# Patient Record
Sex: Female | Born: 1980 | Race: White | Hispanic: No | Marital: Single | Smoking: Never smoker
Health system: Southern US, Community
[De-identification: ages and names within clinical notes are randomized; demographics above are authoritative.]

## PROBLEM LIST (undated history)

## (undated) DIAGNOSIS — E119 Type 2 diabetes mellitus without complications: Secondary | ICD-10-CM

## (undated) DIAGNOSIS — E7800 Pure hypercholesterolemia, unspecified: Secondary | ICD-10-CM

## (undated) HISTORY — PX: NO PAST SURGERIES: SHX2092

---

## 2018-03-14 ENCOUNTER — Ambulatory Visit
Admission: EM | Admit: 2018-03-14 | Discharge: 2018-03-14 | Disposition: A | Discharge status: Home / Self Care | Payer: Self-pay | Attending: Family Medicine | Admitting: Family Medicine

## 2018-03-14 ENCOUNTER — Other Ambulatory Visit: Payer: Self-pay

## 2018-03-14 ENCOUNTER — Ambulatory Visit (INDEPENDENT_AMBULATORY_CARE_PROVIDER_SITE_OTHER): Payer: Self-pay

## 2018-03-14 ENCOUNTER — Encounter: Payer: Self-pay | Admitting: Emergency Medicine

## 2018-03-14 DIAGNOSIS — M25532 Pain in left wrist: Secondary | ICD-10-CM

## 2018-03-14 MED ORDER — NAPROXEN 500 MG PO TABS: 500.0000 mg | ORAL_TABLET | Freq: Two times a day (BID) | ORAL | 0 refills | Status: DC

## 2018-03-14 NOTE — ED Provider Notes (Addendum)
[facility]    CSN: [id number] Arrival date & time: 03/14/18  1753     History   Chief Complaint Chief Complaint  Patient presents with  . Wrist Pain    HPI Madeline Bradley is a 37 y.o. female.   Patient is a 37 year old female who presents complaining of left wrist pain.  Patient reports pain is been ongoing for about 2 months and is worsening.  Patient was seen at the [facility] ER on July 13 with similar symptoms and diagnosed with tendinitis.  Patient reports she does wear a wrist brace while at work and has been taking over the counter ibuprofen for pain.  Patient denies any numbness or tingling but does report pain when she is trying to dress her scratching her back.  Patient also reports pain if she tries to push herself up with her left hand on a closed fist (i.e. with the weight load up the arm).  Patient denies any numbness or tingling.  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     History reviewed. No pertinent past medical history.  There are no active problems to display for this patient.   History reviewed. No pertinent surgical history.  OB History   None      Home Medications    Prior to Admission medications   Medication Sig Start Date End Date Taking? Authorizing Provider  ibuprofen (ADVIL,MOTRIN) 800 MG tablet Take 800 mg by mouth every 8 (eight) hours as needed. for pain 12/25/17   [provider]  naproxen (NAPROSYN) 500 MG tablet Take 1 tablet (500 mg total) by mouth 2 (two) times daily. 03/14/18   Candis Schatz, PA-C    Family History History reviewed. No pertinent family history.  Social History Social History   Tobacco Use  . Smoking status: Never Smoker  . Smokeless tobacco: Never Used  Substance Use Topics  . Alcohol use: Not Currently  . Drug use: Never     Allergies   Patient has no known allergies.   Review of Systems Review of Systems as noted  above HPI.  Other systems reviewed and found to be negative.   Physical Exam Triage Vital Signs ED Triage Vitals [03/14/18 1820]  Enc Vitals Group     BP 136/81     Pulse Rate 93     Resp 18     Temp 98.1 F (36.7 C)     Temp Source Oral     SpO2 100 %     Weight 178 lb (80.7 kg)     Height 5\' 5"  (1.651 m)     Head Circumference      Peak Flow      Pain Score 0     Pain Loc      Pain Edu?      Excl. in GC?    No data found.  Updated Vital Signs BP 136/81 (BP Location: Left Arm)   Pulse 93   Temp 98.1 F (36.7 C) (Oral)   Resp 18   Ht 5\' 5"  (1.651 m)   Wt 178 lb (80.7 kg)   LMP 02/25/2018   SpO2 100%   BMI 29.62 kg/m   Physical Exam  Constitutional: She is oriented to person, place, and time. She appears well-developed and well-nourished. No distress.  HENT:  Head: Normocephalic and atraumatic.  Eyes: Pupils are equal, round, and reactive to light. EOM are normal.  Neck: Normal  range of motion. Neck supple.  Pulmonary/Chest: Effort normal.  Musculoskeletal: Normal range of motion.       Left wrist: She exhibits tenderness. She exhibits no effusion and no crepitus.  Tenderness palpation along the wrist near the base of the thumb.  Negative Phalen negative Tinel sign.  Good range of motion.  Neurological: She is alert and oriented to person, place, and time. No cranial nerve deficit.     UC Treatments / Results  Labs (all labs ordered are listed, but only abnormal results are displayed) Labs Reviewed - No data to display  EKG None  Radiology Dg Wrist Complete Left  Result Date: 03/14/2018 CLINICAL DATA:  Radial left wrist pain for the past 2 months. No known injury. EXAM: LEFT WRIST - COMPLETE 3+ VIEW COMPARISON:  None. FINDINGS: There is no evidence of fracture or dislocation. There is no evidence of arthropathy or other focal bone abnormality. Soft tissues are unremarkable. IMPRESSION: Normal examination. Electronically Signed   By: Beckie Salts M.D.    On: 03/14/2018 19:17    Procedures Procedures (including critical care time)  Medications Ordered in UC Medications - No data to display  Initial Impression / Assessment and Plan / UC Course  I have reviewed the triage vital signs and the nursing notes.  Pertinent labs & imaging results that were available during my care of the patient were reviewed by me and considered in my medical decision making (see chart for details).     Patient with left wrist pain.  Will check x-ray.  Has been given diagnosis of tendinitis in the past when seen at the [facility] ER.  Does take over-the-counter ibuprofen and states she does wear a wrist brace during the day and while working.  -X-ray negative for any acute abnormality.  Give patient prescription for naproxen advised not to take ibuprofen will take the naproxen regularly.  Also give her instructions for wrist exercises to help ease the pain.  Also recommend she use her brace while at work.  Also recommend ice therapy for 15 to 20 minutes 3 to 4 times a day.  We will also recommend that she follow-up with orthopedics if no improvement.  Knee brace given to patient at her request.  Final Clinical Impressions(s) / UC Diagnoses   Final diagnoses:  Left wrist pain     Discharge Instructions     -naproxen: one tablet twice a day -do not take ibuprofen while taking the naproxen on a regular basis -xray with no abnormalities -wrist exercises as attached -cold therapy for 15-20 minutes 3-4 times a day -can follow up with orthopedics. [facility] in [place] has an urgent care clinic.    ED Prescriptions    Medication Sig Dispense Auth. Provider   naproxen (NAPROSYN) 500 MG tablet Take 1 tablet (500 mg total) by mouth 2 (two) times daily. 30 tablet Candis Schatz, PA-C     Controlled Substance Prescriptions [place] Controlled Substance Registry consulted? Not Applicable   Candis Schatz, PA-C 03/14/18 1936    Candis Schatz,  PA-C 03/14/18 1943

## 2018-03-14 NOTE — Discharge Instructions (Signed)
-  naproxen: one tablet twice a day -do not take ibuprofen while taking the naproxen on a regular basis -xray with no abnormalities -wrist exercises as attached -cold therapy for 15-20 minutes 3-4 times a day -can follow up with orthopedics. [facility] in [place] has an urgent care clinic.

## 2018-03-14 NOTE — ED Triage Notes (Signed)
Patient c/o left wrist pain x 2 months. Patient denies injury to the area. Patient stated she was seen at [facility] and diagnosed with carpal tunnel and she states the pain is getting worse.

## 2018-05-24 ENCOUNTER — Ambulatory Visit
Admission: EM | Admit: 2018-05-24 | Discharge: 2018-05-24 | Disposition: A | Discharge status: Home / Self Care | Payer: Commercial Managed Care - PPO | Attending: Emergency Medicine | Admitting: Emergency Medicine

## 2018-05-24 ENCOUNTER — Other Ambulatory Visit: Payer: Self-pay

## 2018-05-24 ENCOUNTER — Encounter: Payer: Self-pay | Admitting: Emergency Medicine

## 2018-05-24 DIAGNOSIS — Z8249 Family history of ischemic heart disease and other diseases of the circulatory system: Secondary | ICD-10-CM | POA: Diagnosis not present

## 2018-05-24 DIAGNOSIS — N9089 Other specified noninflammatory disorders of vulva and perineum: Secondary | ICD-10-CM | POA: Diagnosis not present

## 2018-05-24 DIAGNOSIS — R739 Hyperglycemia, unspecified: Secondary | ICD-10-CM | POA: Diagnosis not present

## 2018-05-24 DIAGNOSIS — E1165 Type 2 diabetes mellitus with hyperglycemia: Secondary | ICD-10-CM | POA: Insufficient documentation

## 2018-05-24 DIAGNOSIS — N898 Other specified noninflammatory disorders of vagina: Secondary | ICD-10-CM | POA: Insufficient documentation

## 2018-05-24 LAB — WET PREP, GENITAL
CLUE CELLS WET PREP: NONE SEEN
SPERM: NONE SEEN
TRICH WET PREP: NONE SEEN
YEAST WET PREP: NONE SEEN

## 2018-05-24 LAB — GLUCOSE, CAPILLARY: GLUCOSE-CAPILLARY: 250 mg/dL — AB (ref 70–99)

## 2018-05-24 MED ORDER — TRIAMCINOLONE ACETONIDE 0.1 % EX CREA: 1.0000 "application " | TOPICAL_CREAM | Freq: Two times a day (BID) | CUTANEOUS | 0 refills | Status: DC

## 2018-05-24 MED ORDER — MUPIROCIN 2 % EX OINT: 1.0000 "application " | TOPICAL_OINTMENT | Freq: Two times a day (BID) | CUTANEOUS | 0 refills | Status: DC

## 2018-05-24 NOTE — Discharge Instructions (Addendum)
rinse yourself with lukewarm water instead of wiping or using wipes that have preservatives in it that may irritate the skin even further. Then apply the Bactroban ointment. Then apply a layer zinc paste over the ointment. You can also apply the triamcinolone ointment in the minimal amount necessary to reduce inflammation if needed.   Your sugar was high today.  I would restart your diet and exercise to start bring it down.  Follow-up with your primary care physician of your choice for reevaluation and to possibly be started on metformin.  Here is a list of primary care providers who are taking new patients:  Dr. Elizabeth Sauereanna Jones, Dr. Schuyler AmorWilliam Plonk [address] [place] [address] [phone]  [facility] [place] [address]  [place] [address]  [phone]  [facility] [address]  [place], [place] [address] [phone]  [facility] [place] [address]  [phone] [place], [place] [address]  Here are clinics/ other resources who will see you if you do not have insurance. Some have certain criteria that you must meet. Call them and find out what they are:  [facility]: [address] [place], [place] [address] Phone: [phone] Hours: First and Third Saturdays of each Month, 9 a.m. - 1 p.m.  [facility]: [address], [place], [place] [address] Phone: [phone] Hours: Tuesday, 4 p.m. - 8 p.m. Thursday, 1 p.m. - 8 p.m. Wednesday, 9 a.m. - [facility]  [facility] [address], [place], [place] [address] Phone: [phone] Pharmacy Phone Number: [phone] Dental Phone Number: [phone] [facility] Insurance Help: [phone]  Dental Hours: Monday - Thursday, 8 a.m. - 6 p.m.  [facility] [address] [place], [place] [address] Phone: [phone] Pharmacy Phone Number: [phone] [facility] Insurance Help: [phone]  [facility] [address] [place], [place] [address] Phone: [phone] Pharmacy Phone Number: [phone] [facility] Insurance Help: [phone]  [facility] [address] [place], [place] [address] Phone: [phone] [facility] Insurance Help: [phone]   [facility] [address] [place], [place] [address] Phone: [phone]  Go to www.goodrx.com to look up your medications. This will give you a list of where you can find your prescriptions at the most affordable prices. Or ask the pharmacist what the cash price is, or if they have any other discount programs available to help make your medication more affordable. This can be less expensive than what you would pay with insurance.

## 2018-05-24 NOTE — ED Provider Notes (Signed)
HPI  SUBJECTIVE:  Madeline Bradley is a 37 y.o. female who presents with 1 week of vaginal irritation.  She reports occasional itching, states that it hurts when she wipes and when she walks.  She states that she "digs" at her labia when she does wipe.  She states that she shaves frequently.  She thinks that her labia are rubbed raw.  No labial burning, swelling, known rash.  No vaginal odor, discharge, bleeding.  No dysuria, urgency, frequency, cloudy odorous urine, hematuria.  No fevers, back, abdominal, pelvic pain.  No new perfumes soaps or body washes.  No new lotions.  She had intercourse with a new female partner 1 week ago who is asymptomatic to her knowledge.  STDs are not a concern today.  They did not use condoms.  She is unsure if her symptoms started after intercourse.  She has been applying Neosporin with improvement in her symptoms.  Symptoms are worse with wiping, walking.  She has a past medical history of dry vaginal skin, borderline diabetes which is largely controlled with diet and exercise.  States that she is supposed to be on metformin but has not been on it in several years.  Currently does not have a prescription for metformin.  She does not check her glucose at home.  No history of gonorrhea, chlamydia, HIV, HSV, syphilis, Trichomonas, BV, yeast, PID, ectopic pregnancy, hypertension.  LMP: 3 weeks ago.  Denies the possibility of being pregnant.  PMD: None.    History reviewed. No pertinent past medical history.  Past Surgical History:  Procedure Laterality Date  . NO PAST SURGERIES      Family History  Problem Relation Age of Onset  . Diabetes Mother   . Hypertension Mother   . Hyperlipidemia Mother   . Other Father        unknown medical history    Social History   Tobacco Use  . Smoking status: Never Smoker  . Smokeless tobacco: Never Used  Substance Use Topics  . Alcohol use: Yes    Comment: occassional  . Drug use: Never    No current  facility-administered medications for this encounter.   Current Outpatient Medications:  .  mupirocin ointment (BACTROBAN) 2 %, Apply 1 application topically 2 (two) times daily., Disp: 22 g, Rfl: 0 .  triamcinolone cream (KENALOG) 0.1 %, Apply 1 application topically 2 (two) times daily. Apply for 2 weeks. May use on face, Disp: 30 g, Rfl: 0  No Known Allergies   ROS  As noted in HPI.   Physical Exam  BP (!) 128/94 (BP Location: Left Arm)   Pulse 88   Temp 98 F (36.7 C) (Oral)   Resp 16   Ht 5\' 5"  (1.651 m)   Wt 83.9 kg   LMP 05/03/2018 (Approximate)   SpO2 100%   BMI 30.79 kg/m   Constitutional: Well developed, well nourished, no acute distress Eyes:  EOMI, conjunctiva normal bilaterally HENT: Normocephalic, atraumatic,mucus membranes moist Respiratory: Normal inspiratory effort Cardiovascular: Normal rate GI: nondistended soft, nontender. No suprapubic tenderness  back: No CVA tenderness GU: Shaved genitalia.  External labia erythematous, irritated.  Positive excoriation and fissures.  No ulcers.  Normal vaginal mucosa.  Normal os.  Brownish discharge consistent with menses from os.  No other discharge.  Chaperone present during exam skin: No rash, skin intact Musculoskeletal: no deformities Neurologic: Alert & oriented x 3, no focal neuro deficits Psychiatric: Speech and behavior appropriate   ED Course   Medications -  No data to display  Orders Placed This Encounter  Procedures  . Pelvic exam    Standing Status:   Standing    Number of Occurrences:   1  . Wet prep, genital    Standing Status:   Standing    Number of Occurrences:   1  . Glucose, capillary    Standing Status:   Standing    Number of Occurrences:   1  . CBG monitoring, ED    Standing Status:   Standing    Number of Occurrences:   1    Results for orders placed or performed during the hospital encounter of 05/24/18 (from the past 24 hour(s))  Wet prep, genital     Status: Abnormal    Collection Time: 05/24/18  6:39 PM  Result Value Ref Range   Yeast Wet Prep HPF POC NONE SEEN NONE SEEN   Trich, Wet Prep NONE SEEN NONE SEEN   Clue Cells Wet Prep HPF POC NONE SEEN NONE SEEN   WBC, Wet Prep HPF POC FEW (A) NONE SEEN   Sperm NONE SEEN   Glucose, capillary     Status: Abnormal   Collection Time: 05/24/18  7:06 PM  Result Value Ref Range   Glucose-Capillary 250 (H) 70 - 99 mg/dL   No results found.  ED Clinical Impression  Labial irritation  Hyperglycemia   ED Assessment/Plan  Patient Declined testing for gonorrhea and chlamydia, HIV, RPR.  Wet prep is negative for BV, yeast, trichomonas.  Will check a fingerstick.  Urine not done as she has no urinary complaints.  Denies possibility of being pregnant.  Discontinue Neosporin as the neomycin can contribute to contact dermatitis.    H&P most c/w labial irritation, aggravated with shaving and with scratching/wiping herself.  We will have her do sitz baths, and apply a zinc barrier cream, Bactroban, low potency steroid cream twice a day.  Advised patient to stop Shaving.   Patient also noted to be hyperglycemic, however she states that her diabetes has been controlled in the past with diet and exercise.  Advised her to try this first, and she can follow-up with a primary care physician of her choice for reevaluation and possibly start metformin.   Follow-up with PMD of choice. Will provide primary care referral list for ongoing care.  Discussed labs, MDM, plan and followup with patient. Pt agrees with plan.    Meds ordered this encounter  Medications  . mupirocin ointment (BACTROBAN) 2 %    Sig: Apply 1 application topically 2 (two) times daily.    Dispense:  22 g    Refill:  0  . triamcinolone cream (KENALOG) 0.1 %    Sig: Apply 1 application topically 2 (two) times daily. Apply for 2 weeks. May use on face    Dispense:  30 g    Refill:  0    *This clinic note was created using Scientist, clinical (histocompatibility and immunogenetics).  Therefore, there may be occasional mistakes despite careful proofreading.  ?     Domenick Gong, MD 05/24/18 [phone]

## 2018-05-24 NOTE — ED Triage Notes (Signed)
Patient in today c/o vaginal irritation x 1 week. Patient states it hurts when she wipes. Patient states she "digs when she wipes". Patient states that she has very dry skin. Patient denies discharge or itching. Patient denies urinary symptoms.

## 2018-07-26 ENCOUNTER — Other Ambulatory Visit: Payer: Self-pay

## 2018-07-26 ENCOUNTER — Ambulatory Visit
Admission: EM | Admit: 2018-07-26 | Discharge: 2018-07-26 | Disposition: A | Discharge status: Home / Self Care | Payer: Commercial Managed Care - PPO | Attending: Family Medicine | Admitting: Family Medicine

## 2018-07-26 DIAGNOSIS — M25511 Pain in right shoulder: Secondary | ICD-10-CM | POA: Diagnosis not present

## 2018-07-26 DIAGNOSIS — M25512 Pain in left shoulder: Secondary | ICD-10-CM | POA: Diagnosis not present

## 2018-07-26 MED ORDER — PREDNISONE 20 MG PO TABS: 20.0000 mg | ORAL_TABLET | Freq: Every day | ORAL | 0 refills | Status: DC

## 2018-07-26 MED ORDER — CYCLOBENZAPRINE HCL 10 MG PO TABS: 10.0000 mg | ORAL_TABLET | Freq: Every day | ORAL | 0 refills | Status: DC

## 2018-07-26 NOTE — ED Triage Notes (Signed)
Patient complains of bilateral shoulder pain worse on right than left. Patient states that she has had no injury to area. Patient states that she was seen by [facility] and told not rotator cuff. Patient states that pain is worse when laying down.

## 2018-07-26 NOTE — ED Provider Notes (Signed)
[facility]    CSN: [id number] Arrival date & time: 07/26/18  1844     History   Chief Complaint Chief Complaint  Patient presents with  . Shoulder Pain    bilateral    HPI Valoree Armenteros is a 38 y.o. female.   38 yo female with a c/o bilateral shoulder pain, right greater than left. Denies any injuries, falls, fevers, chills, rash. C/o pain to upper back area.   The history is provided by the patient.    History reviewed. No pertinent past medical history.  There are no active problems to display for this patient.   Past Surgical History:  Procedure Laterality Date  . NO PAST SURGERIES      OB History   No obstetric history on file.      Home Medications    Prior to Admission medications   Medication Sig Start Date End Date Taking? Authorizing Provider  cyclobenzaprine (FLEXERIL) 10 MG tablet Take 1 tablet (10 mg total) by mouth at bedtime. 07/26/18   Payton Mccallum, MD  mupirocin ointment (BACTROBAN) 2 % Apply 1 application topically 2 (two) times daily. 05/24/18   Domenick Gong, MD  predniSONE (DELTASONE) 20 MG tablet Take 1 tablet (20 mg total) by mouth daily. 07/26/18   Payton Mccallum, MD  triamcinolone cream (KENALOG) 0.1 % Apply 1 application topically 2 (two) times daily. Apply for 2 weeks. May use on face 05/24/18   Domenick Gong, MD    Family History Family History  Problem Relation Age of Onset  . Diabetes Mother   . Hypertension Mother   . Hyperlipidemia Mother   . Other Father        unknown medical history    Social History Social History   Tobacco Use  . Smoking status: Never Smoker  . Smokeless tobacco: Never Used  Substance Use Topics  . Alcohol use: Yes    Comment: occasional  . Drug use: Never     Allergies   Patient has no known allergies.   Review of Systems Review of Systems   Physical Exam Triage Vital Signs ED Triage Vitals  Enc Vitals Group     BP 07/26/18 1916 131/89     Pulse Rate  07/26/18 1916 87     Resp 07/26/18 1916 18     Temp 07/26/18 1916 98 F (36.7 C)     Temp Source 07/26/18 1916 Oral     SpO2 07/26/18 1916 100 %     Weight 07/26/18 1915 170 lb (77.1 kg)     Height 07/26/18 1915 5\' 5"  (1.651 m)     Head Circumference --      Peak Flow --      Pain Score 07/26/18 1915 0     Pain Loc --      Pain Edu? --      Excl. in GC? --    No data found.  Updated Vital Signs BP 131/89 (BP Location: Right Arm)   Pulse 87   Temp 98 F (36.7 C) (Oral)   Resp 18   Ht 5\' 5"  (1.651 m)   Wt 77.1 kg   LMP 07/24/2018   SpO2 100%   BMI 28.29 kg/m   Visual Acuity Right Eye Distance:   Left Eye Distance:   Bilateral Distance:    Right Eye Near:   Left Eye Near:    Bilateral Near:     Physical Exam Vitals signs and nursing note reviewed.  Constitutional:  General: She is not in acute distress.    Appearance: Normal appearance. She is not toxic-appearing or diaphoretic.  Musculoskeletal:     Right shoulder: Normal.     Left shoulder: Normal.     Cervical back: She exhibits tenderness (over the right trapezius). She exhibits no bony tenderness, no swelling, no edema, no deformity, no laceration, no pain, no spasm and normal pulse.  Neurological:     Mental Status: She is alert.      UC Treatments / Results  Labs (all labs ordered are listed, but only abnormal results are displayed) Labs Reviewed - No data to display  EKG None  Radiology No results found.  Procedures Procedures (including critical care time)  Medications Ordered in UC Medications - No data to display  Initial Impression / Assessment and Plan / UC Course  I have reviewed the triage vital signs and the nursing notes.  Pertinent labs & imaging results that were available during my care of the patient were reviewed by me and considered in my medical decision making (see chart for details).      Final Clinical Impressions(s) / UC Diagnoses   Final diagnoses:  Right  shoulder pain, unspecified chronicity  Left shoulder pain, unspecified chronicity    ED Prescriptions    Medication Sig Dispense Auth. Provider   cyclobenzaprine (FLEXERIL) 10 MG tablet Take 1 tablet (10 mg total) by mouth at bedtime. 30 tablet Payton Mccallumonty, Xia Stohr, MD   predniSONE (DELTASONE) 20 MG tablet Take 1 tablet (20 mg total) by mouth daily. 5 tablet Payton Mccallumonty, Kayzen Kendzierski, MD     1. diagnosis reviewed with patient 2. rx as per orders above; reviewed possible side effects, interactions, risks and benefits  3. Recommend supportive treatment with otc tylenol prn 4. Follow-up prn if symptoms worsen or don't improve  Controlled Substance Prescriptions [place] Controlled Substance Registry consulted? Not Applicable   Payton Mccallumonty, Ricahrd Schwager, MD 07/26/18 2008

## 2019-01-30 ENCOUNTER — Encounter: Payer: Self-pay | Admitting: Emergency Medicine

## 2019-01-30 ENCOUNTER — Ambulatory Visit
Admission: EM | Admit: 2019-01-30 | Discharge: 2019-01-30 | Disposition: A | Discharge status: Home / Self Care | Payer: Commercial Managed Care - PPO | Attending: Family Medicine | Admitting: Family Medicine

## 2019-01-30 ENCOUNTER — Other Ambulatory Visit: Payer: Self-pay

## 2019-01-30 DIAGNOSIS — R030 Elevated blood-pressure reading, without diagnosis of hypertension: Secondary | ICD-10-CM | POA: Diagnosis not present

## 2019-01-30 NOTE — Discharge Instructions (Signed)
Follow up with PCP. Try and get a sooner appt.  Dietary changes and exercise.  Take care  Dr. Lacinda Axon

## 2019-01-30 NOTE — ED Triage Notes (Signed)
Pt c/o headaches and elevated BP. She states that it has been running 150/90's. She has an appt with a PCP but she could not get an appt until October.

## 2019-02-01 NOTE — ED Provider Notes (Signed)
[facility]    CSN: [id number] Arrival date & time: 01/30/19  1558   History   Chief Complaint Chief Complaint  Patient presents with  . Hypertension  . Headache   HPI  38 year old female presents with the above complaints.  Patient reports that she has had ongoing headaches past week.  Patient is currently headache free.  When asked to tell me the location of her pain, she is unable to do so.  She states that her blood pressure has been elevated for the past 3 days.  She states that it has been running in the 150 over 90s.  BP currently 127/89.  She is currently feeling well.  Patient called her primary care provider and she was instructed to come in for evaluation.  No other reported symptoms.  No other complaints or concerns at this time.  PMH, Surgical Hx, Family Hx, Social History reviewed and updated as below.  PMH: Abnormal Pap smear, Depression, GERD, DM-2  Past Surgical History:  Procedure Laterality Date  . NO PAST SURGERIES      OB History   No obstetric history on file.      Home Medications    Prior to Admission medications   Not on File    Family History Family History  Problem Relation Age of Onset  . Diabetes Mother   . Hypertension Mother   . Hyperlipidemia Mother   . Other Father        unknown medical history    Social History Social History   Tobacco Use  . Smoking status: Never Smoker  . Smokeless tobacco: Never Used  Substance Use Topics  . Alcohol use: Yes    Comment: occasional  . Drug use: Never     Allergies   Patient has no known allergies.   Review of Systems Review of Systems  Cardiovascular:       Elevated BP.  Neurological: Positive for headaches.   Physical Exam Triage Vital Signs ED Triage Vitals  Enc Vitals Group     BP 01/30/19 1622 127/89     Pulse Rate 01/30/19 1622 92     Resp 01/30/19 1622 18     Temp 01/30/19 1622 98.2 F (36.8 C)     Temp Source 01/30/19 1622 Oral     SpO2 01/30/19  1622 99 %     Weight 01/30/19 1619 180 lb (81.6 kg)     Height 01/30/19 1619 5\' 5"  (1.651 m)     Head Circumference --      Peak Flow --      Pain Score 01/30/19 1619 0     Pain Loc --      Pain Edu? --      Excl. in GC? --    Updated Vital Signs BP 127/89 (BP Location: Left Arm)   Pulse 92   Temp 98.2 F (36.8 C) (Oral)   Resp 18   Ht 5\' 5"  (1.651 m)   Wt 81.6 kg   LMP 01/14/2019 (Approximate)   SpO2 99%   BMI 29.95 kg/m   Visual Acuity Right Eye Distance:   Left Eye Distance:   Bilateral Distance:    Right Eye Near:   Left Eye Near:    Bilateral Near:     Physical Exam Vitals signs and nursing note reviewed.  Constitutional:      General: She is not in acute distress.    Appearance: Normal appearance.  HENT:     Head: Normocephalic and  atraumatic.  Eyes:     General:        Right eye: No discharge.        Left eye: No discharge.     Conjunctiva/sclera: Conjunctivae normal.  Cardiovascular:     Rate and Rhythm: Normal rate and regular rhythm.  Pulmonary:     Effort: Pulmonary effort is normal.     Breath sounds: Normal breath sounds. No wheezing, rhonchi or rales.  Neurological:     Mental Status: She is alert.  Psychiatric:        Mood and Affect: Mood normal.        Behavior: Behavior normal.    UC Treatments / Results  Labs (all labs ordered are listed, but only abnormal results are displayed) Labs Reviewed - No data to display  EKG   Radiology No results found.  Procedures Procedures (including critical care time)  Medications Ordered in UC Medications - No data to display  Initial Impression / Assessment and Plan / UC Course  I have reviewed the triage vital signs and the nursing notes.  Pertinent labs & imaging results that were available during my care of the patient were reviewed by me and considered in my medical decision making (see chart for details).    38 year old female presents with recent headaches and reported elevated  blood pressure.  BP stable currently.  She does not have a headache at this time.  Advised dietary and lifestyle changes.  Do not recommend antihypertensive treatment at this time.  Advised follow-up with PCP.  Supportive care.  Final Clinical Impressions(s) / UC Diagnoses   Final diagnoses:  Elevated BP without diagnosis of hypertension     Discharge Instructions     Follow up with PCP. Try and get a sooner appt.  Dietary changes and exercise.  Take care  Dr. Lacinda Axon    ED Prescriptions    None     Controlled Substance Prescriptions [place] Controlled Substance Registry consulted? Not Applicable   [name], [place] 02/01/19 4540

## 2019-05-18 ENCOUNTER — Encounter: Payer: Self-pay | Admitting: Emergency Medicine

## 2019-05-18 ENCOUNTER — Ambulatory Visit
Admission: EM | Admit: 2019-05-18 | Discharge: 2019-05-18 | Disposition: A | Discharge status: Home / Self Care | Payer: Commercial Managed Care - PPO | Attending: Family Medicine | Admitting: Family Medicine

## 2019-05-18 ENCOUNTER — Other Ambulatory Visit: Payer: Self-pay

## 2019-05-18 DIAGNOSIS — Z20822 Contact with and (suspected) exposure to covid-19: Secondary | ICD-10-CM

## 2019-05-18 DIAGNOSIS — Z20828 Contact with and (suspected) exposure to other viral communicable diseases: Secondary | ICD-10-CM

## 2019-05-18 DIAGNOSIS — J069 Acute upper respiratory infection, unspecified: Secondary | ICD-10-CM

## 2019-05-18 HISTORY — DX: Type 2 diabetes mellitus without complications: E11.9

## 2019-05-18 LAB — SARS CORONAVIRUS 2 AG (30 MIN TAT): SARS Coronavirus 2 Ag: NEGATIVE

## 2019-05-18 MED ORDER — BENZONATATE 200 MG PO CAPS: 200.0000 mg | ORAL_CAPSULE | Freq: Three times a day (TID) | ORAL | 0 refills | Status: DC | PRN

## 2019-05-18 NOTE — Discharge Instructions (Addendum)
May return to work once COVID PCR test returns negative.  Cough medication as needed.  Take care  Dr. Lacinda Axon

## 2019-05-18 NOTE — ED Triage Notes (Signed)
Pt c/o cough, and nasal congestion. Started about 2 days ago. She states that she was tested through her work for [employer] yesterday and was negative. Denies fever, shortness of breath.

## 2019-05-18 NOTE — ED Provider Notes (Signed)
[facility]    CSN: [id number] Arrival date & time: 05/18/19  0904  History   Chief Complaint Chief Complaint  Patient presents with  . Cough   HPI   38 year old female presents with cough and congestion as well as runny nose.  Patient reports she developed symptoms 2 days ago.  She states that she has had cough, runny nose, congestion.  She was tested through her work for COVID-19.  She states that it was a saliva test.  She states that she received that negative test results today.  No fever.  No shortness of breath.  She states there has been COVID-19 in her workplace.  She has no known exposure that she is aware of.  No medications or interventions tried.  No known exacerbating factors.  No other complaints.  PMH, Surgical Hx, Family Hx, Social History reviewed and updated as below.  Past Medical History:  Diagnosis Date  . Diabetes mellitus without complication [facility])    Past Surgical History:  Procedure Laterality Date  . NO PAST SURGERIES     OB History   No obstetric history on file.    Home Medications    Prior to Admission medications   Medication Sig Start Date End Date Taking? Authorizing Provider  metFORMIN (GLUCOPHAGE) 500 MG tablet metformin 500 mg tablet  TAKE 1 TABLET BY MOUTH TWICE DAILY WITH MEALS WITH BREAKFAST AND WITH SUPPER 12/11/13  Yes [provider]  benzonatate (TESSALON) 200 MG capsule Take 1 capsule (200 mg total) by mouth 3 (three) times daily as needed for cough. 05/18/19   [name], DO    Family History Family History  Problem Relation Age of Onset  . Diabetes Mother   . Hypertension Mother   . Hyperlipidemia Mother   . Other Father        unknown medical history    Social History Social History   Tobacco Use  . Smoking status: Never Smoker  . Smokeless tobacco: Never Used  Substance Use Topics  . Alcohol use: Yes    Comment: occasional  . Drug use: Never     Allergies   Patient has no known  allergies.   Review of Systems Review of Systems   Physical Exam Triage Vital Signs ED Triage Vitals  Enc Vitals Group     BP 05/18/19 1006 (!) 133/94     Pulse Rate 05/18/19 1006 92     Resp 05/18/19 1006 18     Temp 05/18/19 1006 98.3 F (36.8 C)     Temp Source 05/18/19 1006 Oral     SpO2 05/18/19 1006 100 %     Weight 05/18/19 1002 180 lb (81.6 kg)     Height 05/18/19 1002 5\' 5"  (1.651 m)     Head Circumference --      Peak Flow --      Pain Score 05/18/19 1001 0     Pain Loc --      Pain Edu? --      Excl. in GC? --    No data found.  Updated Vital Signs BP (!) 133/94 (BP Location: Right Arm)   Pulse 92   Temp 98.3 F (36.8 C) (Oral)   Resp 18   Ht 5\' 5"  (1.651 m)   Wt 81.6 kg   LMP 05/11/2019 (Approximate)   SpO2 100%   BMI 29.95 kg/m   Visual Acuity Right Eye Distance:   Left Eye Distance:   Bilateral Distance:  Right Eye Near:   Left Eye Near:    Bilateral Near:     Physical Exam   UC Treatments / Results  Labs (all labs ordered are listed, but only abnormal results are displayed) Labs Reviewed  SARS CORONAVIRUS 2 AG (30 MIN TAT)  NOVEL CORONAVIRUS, NAA (HOSP ORDER, SEND-OUT TO REF LAB; TAT 18-24 HRS)    EKG   Radiology No results found.  Procedures Procedures (including critical care time)  Medications Ordered in UC Medications - No data to display  Initial Impression / Assessment and Plan / UC Course  I have reviewed the triage vital signs and the nursing notes.  Pertinent labs & imaging results that were available during my care of the patient were reviewed by me and considered in my medical decision making (see chart for details).    38 year old female presents with upper respiratory symptoms.  Rapid Covid negative here.  Awaiting send out confirmatory test.  Tessalon Perles for cough.  Supportive care.  Final Clinical Impressions(s) / UC Diagnoses   Final diagnoses:  Upper respiratory tract infection, unspecified type   Encounter for laboratory testing for COVID-19 virus     Discharge Instructions     May return to work once COVID PCR test returns negative.  Cough medication as needed.  Take care  Dr. Lacinda Axon     ED Prescriptions    Medication Sig Dispense Auth. Provider   benzonatate (TESSALON) 200 MG capsule Take 1 capsule (200 mg total) by mouth 3 (three) times daily as needed for cough. 20 capsule [name], DO     PDMP not reviewed this encounter.   [name], [place] 05/18/19 1138

## 2019-05-19 LAB — NOVEL CORONAVIRUS, NAA (HOSP ORDER, SEND-OUT TO REF LAB; TAT 18-24 HRS): SARS-CoV-2, NAA: NOT DETECTED

## 2019-09-15 ENCOUNTER — Ambulatory Visit: Payer: Commercial Managed Care - PPO | Attending: Internal Medicine

## 2019-09-15 ENCOUNTER — Ambulatory Visit: Payer: Commercial Managed Care - PPO

## 2019-09-15 DIAGNOSIS — Z23 Encounter for immunization: Secondary | ICD-10-CM

## 2019-09-15 NOTE — Progress Notes (Signed)
   Covid-19 Vaccination Clinic  Name:  Madeline Bradley    MRN: [id number] DOB: [birth year]  09/15/2019  Madeline Bradley was observed post Covid-19 immunization for 15 minutes without incident. She was provided with Vaccine Information Sheet and instruction to access the V-Safe system.   Madeline Bradley was instructed to call 911 with any severe reactions post vaccine: Marland Kitchen Difficulty breathing  . Swelling of face and throat  . A fast heartbeat  . A bad rash all over body  . Dizziness and weakness   Immunizations Administered    Name Date Dose VIS Date Route   Pfizer COVID-19 Vaccine 09/15/2019 12:20 PM 0.3 mL 05/26/2019 Intramuscular   Manufacturer: [employer], [employer]   Lot: [id number]   NDC: [address]-0

## 2019-10-06 ENCOUNTER — Ambulatory Visit: Payer: Commercial Managed Care - PPO | Attending: Internal Medicine

## 2019-10-06 DIAGNOSIS — Z23 Encounter for immunization: Secondary | ICD-10-CM

## 2019-10-06 NOTE — Progress Notes (Signed)
   Covid-19 Vaccination Clinic  Name:  Tesha Archambeau    MRN: [id number] DOB: [birth year]  10/06/2019  Ms. Delaine was observed post Covid-19 immunization for 15 minutes without incident. She was provided with Vaccine Information Sheet and instruction to access the V-Safe system.   Ms. Ramnath was instructed to call 911 with any severe reactions post vaccine: Marland Kitchen Difficulty breathing  . Swelling of face and throat  . A fast heartbeat  . A bad rash all over body  . Dizziness and weakness   Immunizations Administered    Name Date Dose VIS Date Route   Pfizer COVID-19 Vaccine 10/06/2019  4:05 PM 0.3 mL 08/09/2018 Intramuscular   Manufacturer: [employer], [employer]   Lot: [id number]   NDC: [address]-6

## 2021-02-12 ENCOUNTER — Other Ambulatory Visit: Payer: Self-pay

## 2021-02-12 ENCOUNTER — Ambulatory Visit (INDEPENDENT_AMBULATORY_CARE_PROVIDER_SITE_OTHER): Payer: Commercial Managed Care - PPO

## 2021-02-12 ENCOUNTER — Ambulatory Visit
Admission: EM | Admit: 2021-02-12 | Discharge: 2021-02-12 | Disposition: A | Discharge status: Home / Self Care | Payer: Commercial Managed Care - PPO

## 2021-02-12 DIAGNOSIS — S63502A Unspecified sprain of left wrist, initial encounter: Secondary | ICD-10-CM | POA: Diagnosis not present

## 2021-02-12 DIAGNOSIS — M79642 Pain in left hand: Secondary | ICD-10-CM | POA: Diagnosis not present

## 2021-02-12 DIAGNOSIS — W19XXXA Unspecified fall, initial encounter: Secondary | ICD-10-CM | POA: Diagnosis not present

## 2021-02-12 MED ORDER — IBUPROFEN 600 MG PO TABS: 600.0000 mg | ORAL_TABLET | Freq: Four times a day (QID) | ORAL | 0 refills | Status: DC | PRN

## 2021-02-12 NOTE — Discharge Instructions (Addendum)
Take the ibuprofen, 600 mg every 6 hours with food, as needed for left wrist pain and inflammation.  Wear the wrist splint when you are at work to help keep your wrist in a neutral position and prevent further injury.  After work ice your wrist for 20 minutes at a time to decrease inflammation.  Follow the rehabilitation exercises in the handout in your discharge paperwork to help maintain mobility.  If your symptoms continue I would recommend following up with orthopedics.

## 2021-02-12 NOTE — ED Triage Notes (Signed)
Patient states that she fell on her left hand last week. States that last night she noticed pain with bending or gripping.

## 2021-02-12 NOTE — ED Provider Notes (Signed)
[facility]    CSN: [id number] Arrival date & time: 02/12/21  9379      History   Chief Complaint Chief Complaint  Patient presents with   Fall   Hand Pain    left    HPI Madeline Bradley is a 40 y.o. female.   HPI  40 year old female here for evaluation of left hand and wrist pain.  Patient reports that she fell onto her left side with her hand outstretched a week ago.  She reports that since then she has been having intermittent pain with gripping or flexing her wrist.  She drives a forklift and picks orders.  She states that occasionally when she is using her hand a lot she will get a pins and needle sensation in her hand but she does not have any at present.  Patient also is not having any pain in her wrist at present.  She denies any swelling, bruising, or redness.  Past Medical History:  Diagnosis Date   Diabetes mellitus without complication (HCC)     There are no problems to display for this patient.   Past Surgical History:  Procedure Laterality Date   NO PAST SURGERIES      OB History   No obstetric history on file.      Home Medications    Prior to Admission medications   Medication Sig Start Date End Date Taking? Authorizing Provider  atorvastatin (LIPITOR) 20 MG tablet Take 20 mg by mouth daily. 12/11/20  Yes [provider]  ibuprofen (ADVIL) 600 MG tablet Take 1 tablet (600 mg total) by mouth every 6 (six) hours as needed. 02/12/21  Yes Becky Augusta, NP  JARDIANCE 25 MG TABS tablet Take 25 mg by mouth daily. 02/05/21  Yes [provider]  metFORMIN (GLUCOPHAGE) 500 MG tablet metformin 500 mg tablet  TAKE 1 TABLET BY MOUTH TWICE DAILY WITH MEALS WITH BREAKFAST AND WITH SUPPER 12/11/13  Yes [provider]    Family History Family History  Problem Relation Age of Onset   Diabetes Mother    Hypertension Mother    Hyperlipidemia Mother    Other Father        unknown medical history    Social  History Social History   Tobacco Use   Smoking status: Never   Smokeless tobacco: Never  Vaping Use   Vaping Use: Never used  Substance Use Topics   Alcohol use: Yes    Comment: occasional   Drug use: Never     Allergies   Patient has no known allergies.   Review of Systems Review of Systems  Constitutional:  Negative for activity change, appetite change and fatigue.  Musculoskeletal:  Positive for arthralgias and myalgias. Negative for joint swelling.  Skin:  Negative for color change.  Neurological:  Positive for numbness. Negative for weakness.  Hematological: Negative.   Psychiatric/Behavioral: Negative.      Physical Exam Triage Vital Signs ED Triage Vitals  Enc Vitals Group     BP 02/12/21 1022 (!) 149/91     Pulse Rate 02/12/21 1022 86     Resp 02/12/21 1022 18     Temp 02/12/21 1022 98.4 F (36.9 C)     Temp Source 02/12/21 1022 Oral     SpO2 02/12/21 1022 97 %     Weight 02/12/21 1020 185 lb (83.9 kg)     Height 02/12/21 1020 5\' 5"  (1.651 m)     Head Circumference --  Peak Flow --      Pain Score 02/12/21 1020 3     Pain Loc --      Pain Edu? --      Excl. in GC? --    No data found.  Updated Vital Signs BP (!) 149/91 (BP Location: Right Arm)   Pulse 86   Temp 98.4 F (36.9 C) (Oral)   Resp 18   Ht 5\' 5"  (1.651 m)   Wt 185 lb (83.9 kg)   LMP 02/02/2021   SpO2 97%   BMI 30.79 kg/m   Visual Acuity Right Eye Distance:   Left Eye Distance:   Bilateral Distance:    Right Eye Near:   Left Eye Near:    Bilateral Near:     Physical Exam Vitals and nursing note reviewed.  Constitutional:      General: She is not in acute distress.    Appearance: Normal appearance. She is not ill-appearing.  HENT:     Head: Normocephalic and atraumatic.  Musculoskeletal:        General: Tenderness present. No swelling, deformity or signs of injury. Normal range of motion.  Skin:    General: Skin is warm.     Capillary Refill: Capillary refill  takes less than 2 seconds.     Findings: No bruising or erythema.  Neurological:     General: No focal deficit present.     Mental Status: She is alert and oriented to person, place, and time.     Sensory: No sensory deficit.     Motor: No weakness.  Psychiatric:        Mood and Affect: Mood normal.        Behavior: Behavior normal.        Thought Content: Thought content normal.        Judgment: Judgment normal.     UC Treatments / Results  Labs (all labs ordered are listed, but only abnormal results are displayed) Labs Reviewed - No data to display  EKG   Radiology DG Hand Complete Left  Result Date: 02/12/2021 CLINICAL DATA:  Fall last week, pain EXAM: LEFT HAND - COMPLETE 3+ VIEW COMPARISON:  None. FINDINGS: There is no evidence of fracture or dislocation. There is no evidence of arthropathy or other focal bone abnormality. Soft tissues are unremarkable. IMPRESSION: No fracture or dislocation of the left hand. Joint spaces are preserved. Electronically Signed   By: 02/14/2021 M.D.   On: 02/12/2021 11:04    Procedures Procedures (including critical care time)  Medications Ordered in UC Medications - No data to display  Initial Impression / Assessment and Plan / UC Course  I have reviewed the triage vital signs and the nursing notes.  Pertinent labs & imaging results that were available during my care of the patient were reviewed by me and considered in my medical decision making (see chart for details).  Patient is a nontoxic-appearing 40 year old female here for evaluation of 1 weeks worth of left wrist pain and central palmar pain that is been going on since she fell on outstretched hand.  She states she has been continue to work and use her hand but she will have increases in her pain from time to time.  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  She reports that last night the pain increased and she had a pins and needle sensation in her hand.  The pain  and pins and needle sensation has resolved.  Physical  exam reveals a left hand and wrist that are normal anatomical alignment.  There is no edema, erythema, or ecchymosis present.  When placing patient through full range of motion she endorses the possibility of pain at full extension, flexion, and radial deviation of the wrist but not with ulnar deviation, supination, or pronation.  She states there may be some pain with compression of the radial and ulnar styloids.  There also may be pain with palpation of the carpal bones and with palpation of the third and fourth metacarpals.  There is no deformity noted or crepitus.  Radiograph of left wrist obtained in triage.  Radiographs of left wrist independently reviewed and evaluated by me.  Impression: No evidence of fracture or dislocation.  No significant soft tissue swelling appreciated on exam.  Awaiting radiology overread. Radiology interpretation of left wrist film is negative for fracture or dislocation and joint spaces are well-maintained.  Will place patient in a cock-up wrist splint and treat her for a wrist sprain conservatively with ibuprofen, ice, and range of motion.   Final Clinical Impressions(s) / UC Diagnoses   Final diagnoses:  Left wrist sprain, initial encounter     Discharge Instructions      Take the ibuprofen, 600 mg every 6 hours with food, as needed for left wrist pain and inflammation.  Wear the wrist splint when you are at work to help keep your wrist in a neutral position and prevent further injury.  After work ice your wrist for 20 minutes at a time to decrease inflammation.  Follow the rehabilitation exercises in the handout in your discharge paperwork to help maintain mobility.  If your symptoms continue I would recommend following up with orthopedics.     ED Prescriptions     Medication Sig Dispense Auth. Provider   ibuprofen (ADVIL) 600 MG tablet Take 1 tablet (600 mg total) by mouth every 6 (six) hours  as needed. 30 tablet Becky Augusta, NP      PDMP not reviewed this encounter.   Becky Augusta, NP 02/12/21 1116

## 2021-05-12 ENCOUNTER — Ambulatory Visit
Admission: EM | Admit: 2021-05-12 | Discharge: 2021-05-12 | Disposition: A | Discharge status: Home / Self Care | Payer: Commercial Managed Care - PPO

## 2021-05-12 ENCOUNTER — Other Ambulatory Visit: Payer: Self-pay

## 2021-05-12 DIAGNOSIS — M7712 Lateral epicondylitis, left elbow: Secondary | ICD-10-CM

## 2021-05-12 MED ORDER — METHYLPREDNISOLONE 4 MG PO TBPK: ORAL_TABLET | ORAL | 0 refills | Status: DC

## 2021-05-12 NOTE — ED Provider Notes (Signed)
[facility]    CSN: [id number] Arrival date & time: 05/12/21  0809      History   Chief Complaint Chief Complaint  Patient presents with   Arm Injury    Left arm     HPI Madeline Bradley is a 40 y.o. female.   HPI  40 year old female here for evaluation of left forearm pain.  Patient reports that she is been experiencing pain in her left forearm for last 2 weeks that is associated with a throbbing in the outside of her left elbow.  She states that she cannot think of anything in particular that will provoke the pain and rest does relieve the pain along with taking BC powder.  Patient is left-hand dominant and she works at the [employer] where she does a lot of lifting and repetitive motion.  She denies any numbness, tingling, or weakness in her hand.  Past Medical History:  Diagnosis Date   Diabetes mellitus without complication (HCC)     There are no problems to display for this patient.   Past Surgical History:  Procedure Laterality Date   NO PAST SURGERIES      OB History   No obstetric history on file.      Home Medications    Prior to Admission medications   Medication Sig Start Date End Date Taking? Authorizing Provider  methylPREDNISolone (MEDROL DOSEPAK) 4 MG TBPK tablet Take according to the package insert. 05/12/21  Yes Becky Augusta, NP  atorvastatin (LIPITOR) 20 MG tablet Take 20 mg by mouth daily. 12/11/20   [provider]  ferrous sulfate 325 (65 FE) MG tablet Take by mouth.    [provider]  ibuprofen (ADVIL) 600 MG tablet Take 1 tablet (600 mg total) by mouth every 6 (six) hours as needed. 02/12/21   Becky Augusta, NP  JARDIANCE 25 MG TABS tablet Take 25 mg by mouth daily. 02/05/21   [provider]  metFORMIN (GLUCOPHAGE) 500 MG tablet metformin 500 mg tablet  TAKE 1 TABLET BY MOUTH TWICE DAILY WITH MEALS WITH BREAKFAST AND WITH SUPPER 12/11/13   [provider]    Family History Family History   Problem Relation Age of Onset   Diabetes Mother    Hypertension Mother    Hyperlipidemia Mother    Other Father        unknown medical history    Social History Social History   Tobacco Use   Smoking status: Never   Smokeless tobacco: Never  Vaping Use   Vaping Use: Never used  Substance Use Topics   Alcohol use: Yes    Comment: occasional   Drug use: Never     Allergies   Patient has no known allergies.   Review of Systems Review of Systems  Constitutional:  Negative for activity change, appetite change and fever.  Musculoskeletal:  Positive for arthralgias. Negative for joint swelling.  Skin:  Negative for color change.  Neurological:  Negative for weakness and numbness.  Hematological: Negative.   Psychiatric/Behavioral: Negative.      Physical Exam Triage Vital Signs ED Triage Vitals  Enc Vitals Group     BP 05/12/21 0828 137/87     Pulse Rate 05/12/21 0828 86     Resp 05/12/21 0828 16     Temp 05/12/21 0828 98.7 F (37.1 C)     Temp Source 05/12/21 0828 Oral     SpO2 05/12/21 0828 100 %     Weight --  Height --      Head Circumference --      Peak Flow --      Pain Score 05/12/21 0825 5     Pain Loc --      Pain Edu? --      Excl. in GC? --    No data found.  Updated Vital Signs BP 137/87 (BP Location: Right Arm)   Pulse 86   Temp 98.7 F (37.1 C) (Oral)   Resp 16   LMP 04/21/2021 (Approximate)   SpO2 100%   Visual Acuity Right Eye Distance:   Left Eye Distance:   Bilateral Distance:    Right Eye Near:   Left Eye Near:    Bilateral Near:     Physical Exam Vitals and nursing note reviewed.  Constitutional:      General: She is not in acute distress.    Appearance: Normal appearance. She is not ill-appearing.  HENT:     Head: Normocephalic and atraumatic.  Musculoskeletal:        General: Tenderness present. No swelling, deformity or signs of injury. Normal range of motion.  Skin:    General: Skin is warm and dry.      Capillary Refill: Capillary refill takes less than 2 seconds.     Findings: No bruising or erythema.  Neurological:     General: No focal deficit present.     Mental Status: She is alert and oriented to person, place, and time.     Sensory: No sensory deficit.     Motor: No weakness.  Psychiatric:        Mood and Affect: Mood normal.        Behavior: Behavior normal.        Thought Content: Thought content normal.        Judgment: Judgment normal.     UC Treatments / Results  Labs (all labs ordered are listed, but only abnormal results are displayed) Labs Reviewed - No data to display  EKG   Radiology No results found.  Procedures Procedures (including critical care time)  Medications Ordered in UC Medications - No data to display  Initial Impression / Assessment and Plan / UC Course  I have reviewed the triage vital signs and the nursing notes.  Pertinent labs & imaging results that were available during my care of the patient were reviewed by me and considered in my medical decision making (see chart for details).  Patient is a nontoxic-appearing 40 year old female here for evaluation of left forearm and elbow pain as outlined in HPI above.  Patient's physical exam reveals a left elbow and forearm that are normal anatomical alignment.  Patient has full range of motion of her elbow and wrist without pain or limitation.  There is no edema, erythema, or ecchymosis noted anywhere in the forearm or in the elbow joint.  Patient is distal radial and ulnar pulses are 2+.  Bilateral grips are 5/5.  Patient has no tenderness with palpation of the muscle groups to the posterior lateral forearm or when palpating over the lateral epicondyle of the humerus.  Patient does endorse pain over the lateral epicondyle with passive flexion and extension of her left elbow, as well as supination and pronation of the elbow over the same location.  Suspect patient has lateral epicondylitis, most likely  secondary to her repetitive motion job.  She has been using BC powders with some relief but not any significant improvement.  Patient does have a history of  diabetes.  I have asked her to monitor her sugars as I am going to treat her with a low-dose steroid pack to try and decrease the inflammation.  I have also given her home physical therapy exercises to perform as well as discussed using the compression band that she can purchase at the local drugstore off of [employer].  If she has no improvement of her symptoms in 2 weeks she needs to follow-up with orthopedics.   Final Clinical Impressions(s) / UC Diagnoses   Final diagnoses:  Lateral epicondylitis of left elbow     Discharge Instructions      Take the low dose steroid pack according to the package insert.  Ice your elbow and forearm for 20 minutes after work to decrease inflammation.  Follow the rehab exercises given at triage.  There are compression straps you can wear for your elbow to give support and provide pain relief that you can buy from [facility] or from the drug store.  If your symptoms do not improve after 2 weeks follow up with orthopaedics.      ED Prescriptions     Medication Sig Dispense Auth. Provider   methylPREDNISolone (MEDROL DOSEPAK) 4 MG TBPK tablet Take according to the package insert. 1 each Becky Augusta, NP      PDMP not reviewed this encounter.   Becky Augusta, NP 05/12/21 [phone]

## 2021-05-12 NOTE — ED Triage Notes (Signed)
Patient presents to Urgent Care with complaints of  left lower arm pain x 2 weeks. Pt states limited ROM. Describes pain as a throbbing pain in her left elbow area. Treating symptoms with BC and ibuprofen.

## 2021-05-12 NOTE — Discharge Instructions (Signed)
Take the low dose steroid pack according to the package insert.  Ice your elbow and forearm for 20 minutes after work to decrease inflammation.  Follow the rehab exercises given at triage.  There are compression straps you can wear for your elbow to give support and provide pain relief that you can buy from [facility] or from the drug store.  If your symptoms do not improve after 2 weeks follow up with orthopaedics.

## 2021-07-01 ENCOUNTER — Ambulatory Visit
Admission: EM | Admit: 2021-07-01 | Discharge: 2021-07-01 | Disposition: A | Discharge status: Home / Self Care | Payer: Commercial Managed Care - PPO | Attending: Physician Assistant | Admitting: Physician Assistant

## 2021-07-01 ENCOUNTER — Other Ambulatory Visit: Payer: Self-pay

## 2021-07-01 DIAGNOSIS — J029 Acute pharyngitis, unspecified: Secondary | ICD-10-CM | POA: Insufficient documentation

## 2021-07-01 DIAGNOSIS — J069 Acute upper respiratory infection, unspecified: Secondary | ICD-10-CM | POA: Diagnosis not present

## 2021-07-01 DIAGNOSIS — Z20822 Contact with and (suspected) exposure to covid-19: Secondary | ICD-10-CM | POA: Diagnosis not present

## 2021-07-01 DIAGNOSIS — R051 Acute cough: Secondary | ICD-10-CM | POA: Diagnosis not present

## 2021-07-01 LAB — SARS CORONAVIRUS 2 (TAT 6-24 HRS): SARS Coronavirus 2: NEGATIVE

## 2021-07-01 MED ORDER — PSEUDOEPH-BROMPHEN-DM 30-2-10 MG/5ML PO SYRP: 10.0000 mL | ORAL_SOLUTION | Freq: Four times a day (QID) | ORAL | 0 refills | Status: AC | PRN

## 2021-07-01 NOTE — Discharge Instructions (Signed)
URI/COLD SYMPTOMS: Your exam today is consistent with a viral illness. Antibiotics are not indicated at this time. Use medications as directed, including cough syrup, nasal saline, and decongestants. Your symptoms should improve over the next few days and resolve within 7-10 days. Increase rest and fluids. F/u if symptoms worsen or predominate such as sore throat, ear pain, productive cough, shortness of breath, or if you develop high fevers or worsening fatigue over the next several days.    If COVID-positive you need to isolate 5 days and wear mask for 5 days.  You can would be able to return to work on Thursday as long as you are feeling better and fever free.  Then he should wear your mask for 5 days.

## 2021-07-01 NOTE — ED Triage Notes (Signed)
Patient presents to Urgent Care with complaints of headache, body aches, sore throat, and cough since last Friday. Treating symptoms with robitussin and benadryl.   Denies fever.

## 2021-07-01 NOTE — ED Provider Notes (Signed)
[facility]    CSN: [id number] Arrival date & time: 07/01/21  0911      History   Chief Complaint Chief Complaint  Patient presents with   Generalized Body Aches   Cough   Headache   Sore Throat    HPI Madeline Bradley is a 41 y.o. female presenting for 4-day history of headache, body aches, fatigue, sore throat, cough and congestion.  Denies any associated fever or breathing difficulty.  No diarrhea.  Has been taking Robitussin and Benadryl for symptoms.  No sick contacts or known exposure to COVID or flu.  Does need a COVID test for work.  Reports that her cough was so bad yesterday caused her to vomit 2 times while she was at work.  No other complaints.  HPI  Past Medical History:  Diagnosis Date   Diabetes mellitus without complication (HCC)     There are no problems to display for this patient.   Past Surgical History:  Procedure Laterality Date   NO PAST SURGERIES      OB History   No obstetric history on file.      Home Medications    Prior to Admission medications   Medication Sig Start Date End Date Taking? Authorizing Provider  brompheniramine-pseudoephedrine-DM 30-2-10 MG/5ML syrup Take 10 mLs by mouth 4 (four) times daily as needed for up to 7 days. 07/01/21 07/08/21 Yes Madeline FriendlyEaves, Oluwaseyi Raffel B, PA-C  atorvastatin (LIPITOR) 20 MG tablet Take 20 mg by mouth daily. 12/11/20   [provider]  ferrous sulfate 325 (65 FE) MG tablet Take by mouth.    [provider]  ibuprofen (ADVIL) 600 MG tablet Take 1 tablet (600 mg total) by mouth every 6 (six) hours as needed. 02/12/21   Becky Augustayan, Jeremy, NP  JARDIANCE 25 MG TABS tablet Take 25 mg by mouth daily. 02/05/21   [provider]  metFORMIN (GLUCOPHAGE) 500 MG tablet metformin 500 mg tablet  TAKE 1 TABLET BY MOUTH TWICE DAILY WITH MEALS WITH BREAKFAST AND WITH SUPPER 12/11/13   [provider]  methylPREDNISolone (MEDROL DOSEPAK) 4 MG TBPK tablet Take according to the package  insert. 05/12/21   Becky Augustayan, Jeremy, NP    Family History Family History  Problem Relation Age of Onset   Diabetes Mother    Hypertension Mother    Hyperlipidemia Mother    Other Father        unknown medical history    Social History Social History   Tobacco Use   Smoking status: Never   Smokeless tobacco: Never  Vaping Use   Vaping Use: Never used  Substance Use Topics   Alcohol use: Yes    Comment: occasional   Drug use: Never     Allergies   Patient has no known allergies.   Review of Systems Review of Systems  Constitutional:  Positive for fatigue. Negative for chills, diaphoresis and fever.  HENT:  Positive for congestion, rhinorrhea and sore throat. Negative for ear pain, sinus pressure and sinus pain.   Respiratory:  Positive for cough. Negative for shortness of breath.   Cardiovascular:  Negative for chest pain.  Gastrointestinal:  Positive for vomiting. Negative for abdominal pain and diarrhea.  Musculoskeletal:  Positive for myalgias. Negative for arthralgias.  Skin:  Negative for rash.  Neurological:  Positive for headaches. Negative for weakness.  Hematological:  Negative for adenopathy.    Physical Exam Triage Vital Signs ED Triage Vitals  Enc Vitals Group     BP 07/01/21  0957 (!) 142/94     Pulse Rate 07/01/21 0957 93     Resp 07/01/21 0957 16     Temp 07/01/21 0957 98.3 F (36.8 C)     Temp Source 07/01/21 0957 Oral     SpO2 07/01/21 0957 99 %     Weight --      Height --      Head Circumference --      Peak Flow --      Pain Score 07/01/21 0958 0     Pain Loc --      Pain Edu? --      Excl. in GC? --    No data found.  Updated Vital Signs BP (!) 142/94 (BP Location: Right Arm)    Pulse 93    Temp 98.3 F (36.8 C) (Oral)    Resp 16    SpO2 99%      Physical Exam Vitals and nursing note reviewed.  Constitutional:      General: She is not in acute distress.    Appearance: Normal appearance. She is well-developed. She is  ill-appearing. She is not toxic-appearing.  HENT:     Head: Normocephalic and atraumatic.     Nose: Congestion present.     Mouth/Throat:     Mouth: Mucous membranes are moist.     Pharynx: Oropharynx is clear. Posterior oropharyngeal erythema present.  Eyes:     General: No scleral icterus.       Right eye: No discharge.        Left eye: No discharge.     Conjunctiva/sclera: Conjunctivae normal.  Cardiovascular:     Rate and Rhythm: Normal rate and regular rhythm.     Heart sounds: Normal heart sounds.  Pulmonary:     Effort: Pulmonary effort is normal. No respiratory distress.     Breath sounds: Normal breath sounds. No wheezing, rhonchi or rales.  Musculoskeletal:     Cervical back: Neck supple.  Skin:    General: Skin is dry.  Neurological:     General: No focal deficit present.     Mental Status: She is alert. Mental status is at baseline.     Motor: No weakness.     Gait: Gait normal.  Psychiatric:        Mood and Affect: Mood normal.        Behavior: Behavior normal.        Thought Content: Thought content normal.     UC Treatments / Results  Labs (all labs ordered are listed, but only abnormal results are displayed) Labs Reviewed  SARS CORONAVIRUS 2 (TAT 6-24 HRS)    EKG   Radiology No results found.  Procedures Procedures (including critical care time)  Medications Ordered in UC Medications - No data to display  Initial Impression / Assessment and Plan / UC Course  I have reviewed the triage vital signs and the nursing notes.  Pertinent labs & imaging results that were available during my care of the patient were reviewed by me and considered in my medical decision making (see chart for details).  41 year old female presenting for 4-day history of fatigue, body aches, headaches, cough, congestion and sore throat.  Vital stable.  Patient afebrile.  No respiratory distress.  She is ill-appearing but nontoxic.  Nasal congestion and mild posterior  frontal erythema on exam.  Chest clear auscultation.  PCR COVID testing.  Current CDC guidance, isolation protocol and ED precautions reviewed.  Advised patient she has a  viral illness.  Encouraged increasing rest and fluids.  Sent Bromfed-DM to pharmacy and advised use of nasal sprays, Tylenol Motrin for aches and discomfort.  Advised she should feel better within 7 to 10 days.  Reviewed return and ER precautions and gave her a work note.  Final Clinical Impressions(s) / UC Diagnoses   Final diagnoses:  Viral upper respiratory tract infection  Acute cough  Sore throat     Discharge Instructions      URI/COLD SYMPTOMS: Your exam today is consistent with a viral illness. Antibiotics are not indicated at this time. Use medications as directed, including cough syrup, nasal saline, and decongestants. Your symptoms should improve over the next few days and resolve within 7-10 days. Increase rest and fluids. F/u if symptoms worsen or predominate such as sore throat, ear pain, productive cough, shortness of breath, or if you develop high fevers or worsening fatigue over the next several days.    If COVID-positive you need to isolate 5 days and wear mask for 5 days.  You can would be able to return to work on Thursday as long as you are feeling better and fever free.  Then he should wear your mask for 5 days.     ED Prescriptions     Medication Sig Dispense Auth. Provider   brompheniramine-pseudoephedrine-DM 30-2-10 MG/5ML syrup Take 10 mLs by mouth 4 (four) times daily as needed for up to 7 days. 150 mL Shirlee Latch, PA-C      PDMP not reviewed this encounter.   Shirlee Latch, PA-C 07/01/21 1135

## 2021-07-30 ENCOUNTER — Ambulatory Visit
Admission: EM | Admit: 2021-07-30 | Discharge: 2021-07-30 | Disposition: A | Discharge status: Home / Self Care | Payer: Commercial Managed Care - PPO | Attending: Emergency Medicine | Admitting: Emergency Medicine

## 2021-07-30 ENCOUNTER — Other Ambulatory Visit: Payer: Self-pay

## 2021-07-30 DIAGNOSIS — L03011 Cellulitis of right finger: Secondary | ICD-10-CM | POA: Diagnosis not present

## 2021-07-30 HISTORY — DX: Pure hypercholesterolemia, unspecified: E78.00

## 2021-07-30 MED ORDER — DOXYCYCLINE HYCLATE 100 MG PO CAPS
100.0000 mg | ORAL_CAPSULE | Freq: Two times a day (BID) | ORAL | 0 refills | Status: AC
Start: 2021-07-30 — End: 2021-08-06

## 2021-07-30 NOTE — Discharge Instructions (Signed)
Take the Doxycyline twice daily with food for 7 days.  Soak your finger in warm water and Epsom salts 2-3 times a day to help facilitate drainage.  You can use over-the-counter Tylenol and ibuprofen according to the package instructions as needed for pain.  Return for reevaluation if you develop any increased redness, swelling, drainage, or red streaks going up your finger, or fever.

## 2021-07-30 NOTE — ED Triage Notes (Signed)
Pt reports 1-2 weeks of pain and redness around right thumb nail bed.

## 2021-07-30 NOTE — ED Provider Notes (Signed)
[facility]    CSN: [id number] Arrival date & time: 07/30/21  2620      History   Chief Complaint Chief Complaint  Patient presents with   Thumb Pain    HPI Madeline Bradley is a 41 y.o. female.   HPI  41 year old female here for evaluation of right thumb pain and redness.  Patient ports that she been experiencing pain around the cuticle of her right thumbnail for the last 1 to 2 weeks.  It has been red and painful but it has not been draining.  She denies any fever.  She states that she does not go to nail salon's and she does not bite her nails.  Past Medical History:  Diagnosis Date   Diabetes mellitus without complication (HCC)    High cholesterol     There are no problems to display for this patient.   Past Surgical History:  Procedure Laterality Date   NO PAST SURGERIES      OB History   No obstetric history on file.      Home Medications    Prior to Admission medications   Medication Sig Start Date End Date Taking? Authorizing Provider  doxycycline (VIBRAMYCIN) 100 MG capsule Take 1 capsule (100 mg total) by mouth 2 (two) times daily for 7 days. 07/30/21 08/06/21 Yes Becky Augusta, NP  ferrous sulfate 325 (65 FE) MG tablet Take by mouth.    [provider]  JARDIANCE 25 MG TABS tablet Take 25 mg by mouth daily. 02/05/21   [provider]  metFORMIN (GLUCOPHAGE) 500 MG tablet metformin 500 mg tablet  TAKE 1 TABLET BY MOUTH TWICE DAILY WITH MEALS WITH BREAKFAST AND WITH SUPPER 12/11/13   [provider]    Family History Family History  Problem Relation Age of Onset   Diabetes Mother    Hypertension Mother    Hyperlipidemia Mother    Other Father        unknown medical history    Social History Social History   Tobacco Use   Smoking status: Never    Passive exposure: Never   Smokeless tobacco: Never  Vaping Use   Vaping Use: Never used  Substance Use Topics   Alcohol use: Yes    Comment: occasional    Drug use: Never     Allergies   Patient has no known allergies.   Review of Systems Review of Systems  Constitutional:  Negative for activity change, appetite change and fever.  Musculoskeletal:  Negative for arthralgias and joint swelling.  Skin:  Positive for color change.  Neurological:  Negative for numbness.  Hematological: Negative.   Psychiatric/Behavioral: Negative.      Physical Exam Triage Vital Signs ED Triage Vitals  Enc Vitals Group     BP 07/30/21 0918 136/86     Pulse Rate 07/30/21 0918 95     Resp 07/30/21 0918 16     Temp 07/30/21 0918 98.1 F (36.7 C)     Temp Source 07/30/21 0918 Oral     SpO2 07/30/21 0918 100 %     Weight 07/30/21 0917 177 lb (80.3 kg)     Height 07/30/21 0917 5\' 5"  (1.651 m)     Head Circumference --      Peak Flow --      Pain Score --      Pain Loc --      Pain Edu? --      Excl. in GC? --    No  data found.  Updated Vital Signs BP 136/86 (BP Location: Left Arm)    Pulse 95    Temp 98.1 F (36.7 C) (Oral)    Resp 16    Ht 5\' 5"  (1.651 m)    Wt 177 lb (80.3 kg)    LMP 07/01/2021    SpO2 100%    BMI 29.45 kg/m   Visual Acuity Right Eye Distance:   Left Eye Distance:   Bilateral Distance:    Right Eye Near:   Left Eye Near:    Bilateral Near:     Physical Exam Vitals and nursing note reviewed.  Constitutional:      Appearance: Normal appearance. She is not ill-appearing.  HENT:     Head: Normocephalic and atraumatic.  Skin:    General: Skin is warm.     Capillary Refill: Capillary refill takes less than 2 seconds.     Findings: Erythema present. No lesion or rash.  Neurological:     General: No focal deficit present.     Mental Status: She is alert and oriented to person, place, and time.     Sensory: No sensory deficit.  Psychiatric:        Mood and Affect: Mood normal.        Behavior: Behavior normal.        Thought Content: Thought content normal.        Judgment: Judgment normal.     UC Treatments /  Results  Labs (all labs ordered are listed, but only abnormal results are displayed) Labs Reviewed - No data to display  EKG   Radiology No results found.  Procedures Procedures (including critical care time)  Medications Ordered in UC Medications - No data to display  Initial Impression / Assessment and Plan / UC Course  I have reviewed the triage vital signs and the nursing notes.  Pertinent labs & imaging results that were available during my care of the patient were reviewed by me and considered in my medical decision making (see chart for details).  Patient is a very pleasant, nontoxic-appearing 41 year old female here for evaluation of pain and redness to the lateral and proximal edge of her cuticle to her right thumbnail there is no fluctuance or induration.  She denies any drainage.  On exam patient has full range of motion of her thumb and denies any tenderness at the IP joint.  The skin around the cuticle is dried, cracked, and peeling.  Patient states that she does not bite her nails, to the skin off, and does not frequent nail salon's.  Patient's exam is consistent with a paronychia.  I will discharge her home on doxycycline twice daily for 7 days.  I have also encouraged her to soak her finger in warm water and Epsom salts to see if she can facilitate any drainage.  We discussed return precautions to include worsening symptoms.  Patient denies need for work note.   Final Clinical Impressions(s) / UC Diagnoses   Final diagnoses:  Paronychia of thumb, right     Discharge Instructions      Take the Doxycyline twice daily with food for 7 days.  Soak your finger in warm water and Epsom salts 2-3 times a day to help facilitate drainage.  You can use over-the-counter Tylenol and ibuprofen according to the package instructions as needed for pain.  Return for reevaluation if you develop any increased redness, swelling, drainage, or red streaks going up your finger, or  fever.  ED Prescriptions     Medication Sig Dispense Auth. Provider   doxycycline (VIBRAMYCIN) 100 MG capsule Take 1 capsule (100 mg total) by mouth 2 (two) times daily for 7 days. 14 capsule Becky Augusta, NP      PDMP not reviewed this encounter.   Becky Augusta, NP 07/30/21 1030

## 2022-03-01 ENCOUNTER — Emergency Department: Payer: Commercial Managed Care - PPO

## 2022-03-01 ENCOUNTER — Other Ambulatory Visit: Payer: Self-pay

## 2022-03-01 ENCOUNTER — Emergency Department
Admission: EM | Admit: 2022-03-01 | Discharge: 2022-03-01 | Disposition: A | Discharge status: Home / Self Care | Payer: Commercial Managed Care - PPO | Attending: Emergency Medicine | Admitting: Emergency Medicine

## 2022-03-01 DIAGNOSIS — M545 Low back pain, unspecified: Secondary | ICD-10-CM | POA: Insufficient documentation

## 2022-03-01 DIAGNOSIS — E119 Type 2 diabetes mellitus without complications: Secondary | ICD-10-CM | POA: Diagnosis not present

## 2022-03-01 DIAGNOSIS — S069X9A Unspecified intracranial injury with loss of consciousness of unspecified duration, initial encounter: Secondary | ICD-10-CM | POA: Insufficient documentation

## 2022-03-01 DIAGNOSIS — S199XXA Unspecified injury of neck, initial encounter: Secondary | ICD-10-CM | POA: Diagnosis not present

## 2022-03-01 DIAGNOSIS — S60919A Unspecified superficial injury of unspecified wrist, initial encounter: Secondary | ICD-10-CM | POA: Insufficient documentation

## 2022-03-01 DIAGNOSIS — Y92410 Unspecified street and highway as the place of occurrence of the external cause: Secondary | ICD-10-CM | POA: Diagnosis not present

## 2022-03-01 DIAGNOSIS — S6990XA Unspecified injury of unspecified wrist, hand and finger(s), initial encounter: Secondary | ICD-10-CM
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
## 2022-03-01 NOTE — Discharge Instructions (Signed)
Take acetaminophen 650 mg and ibuprofen 400 mg every 6 hours for pain.  Take with food. Thank you for choosing us for your health care today!  Please see your primary doctor this week for a follow up appointment.   If you do not have a primary doctor call the following clinics to establish care:  If you have insurance:  [facility] [phone] [address] [place] [address]   [facility]  [phone] [address] [place] [address]   If you do not have insurance:  [facility]  [phone] [address] [place] [address]  Sometimes, in the early stages of certain disease courses it is difficult to detect in the emergency department evaluation -- so, it is important that you continue to monitor your symptoms and call your doctor right away or return to the emergency department if you develop any new or worsening symptoms.  It was my pleasure to care for you today.   Alisen Marsiglia S. Waleska Buttery, MD  

## 2022-04-24 ENCOUNTER — Ambulatory Visit
Admission: EM | Admit: 2022-04-24 | Discharge: 2022-04-24 | Disposition: A | Discharge status: Home / Self Care | Payer: Commercial Managed Care - PPO | Attending: Emergency Medicine | Admitting: Emergency Medicine

## 2022-04-24 DIAGNOSIS — L03031 Cellulitis of right toe: Secondary | ICD-10-CM

## 2022-04-24 MED ORDER — IBUPROFEN 800 MG PO TABS
800.0000 mg | ORAL_TABLET | Freq: Three times a day (TID) | ORAL | 0 refills | Status: AC
Start: 2022-04-24 — End: ?

## 2022-04-24 MED ORDER — DOXYCYCLINE HYCLATE 100 MG PO CAPS: 100.0000 mg | ORAL_CAPSULE | Freq: Two times a day (BID) | ORAL | 0 refills | Status: AC

## 2022-04-24 NOTE — Discharge Instructions (Signed)
Today you are being treated for infection to your toenail  Begin doxycycline every morning and every evening for 10 days, ideally you will begin to see improvement in about 48 hours and steady progression from there  You may complete warm soaks to the toe to help reduce swelling and facilitate drainage or you may hold warm compresses whichever is easier  You may take ibuprofen 800 mg every 8 hours as well as Tylenol 500 to 1000 mg every 6 hours for management of discomfort  Do Not attempt to cut toenails until all symptoms have resolved if there is any other concerns regarding a ingrown toenail you may follow-up with podiatry for removal after infection has resolved  After completion of antibiotic if you continue to see signs of infection please follow-up for reevaluation with urgent care or primary doctor

## 2022-04-24 NOTE — ED Triage Notes (Signed)
Pt c/o right first toe pain, drainage, swelling, and redness x2weeks.

## 2022-04-24 NOTE — ED Provider Notes (Signed)
[facility]    CSN: [id number] Arrival date & time: 04/24/22  1033      History   Chief Complaint Chief Complaint  Patient presents with   Toe Pain         HPI Madeline Bradley is a 41 y.o. female.   Presents with erythema, yellow puslike drainage and tenderness present to the right great toe for 2 weeks.  Symptoms began after cutting into toenail with clippers.  Has been cleansing with peroxide with no relief.  Able to bear weight, range of motion of the toe intact..  Denies fevers.  History of diabetes.    Past Medical History:  Diagnosis Date   Diabetes mellitus without complication (HCC)    High cholesterol     There are no problems to display for this patient.   Past Surgical History:  Procedure Laterality Date   NO PAST SURGERIES      OB History   No obstetric history on file.      Home Medications    Prior to Admission medications   Medication Sig Start Date End Date Taking? Authorizing Provider  ferrous sulfate 325 (65 FE) MG tablet Take by mouth.   Yes [provider]  JARDIANCE 25 MG TABS tablet Take 25 mg by mouth daily. 02/05/21  Yes [provider]  metFORMIN (GLUCOPHAGE) 500 MG tablet metformin 500 mg tablet  TAKE 1 TABLET BY MOUTH TWICE DAILY WITH MEALS WITH BREAKFAST AND WITH SUPPER 12/11/13  Yes [provider]    Family History Family History  Problem Relation Age of Onset   Diabetes Mother    Hypertension Mother    Hyperlipidemia Mother    Other Father        unknown medical history    Social History Social History   Tobacco Use   Smoking status: Never    Passive exposure: Never   Smokeless tobacco: Never  Vaping Use   Vaping Use: Never used  Substance Use Topics   Alcohol use: Yes    Comment: occasional   Drug use: Never     Allergies   Patient has no known allergies.   Review of Systems Review of Systems  Constitutional: Negative.   Respiratory: Negative.     Cardiovascular: Negative.   Musculoskeletal: Negative.   Skin:  Positive for wound. Negative for color change, pallor and rash.     Physical Exam Triage Vital Signs ED Triage Vitals  Enc Vitals Group     BP 04/24/22 1045 (!) 150/83     Pulse Rate 04/24/22 1045 92     Resp 04/24/22 1045 18     Temp 04/24/22 1045 98 F (36.7 C)     Temp Source 04/24/22 1045 Oral     SpO2 04/24/22 1045 100 %     Weight 04/24/22 1044 180 lb (81.6 kg)     Height 04/24/22 1044 5\' 5"  (1.651 m)     Head Circumference --      Peak Flow --      Pain Score 04/24/22 1044 10     Pain Loc --      Pain Edu? --      Excl. in [place]? --    No data found.  Updated Vital Signs BP (!) 150/83 (BP Location: Left Arm)   Pulse 92   Temp 98 F (36.7 C) (Oral)   Resp 18   Ht 5\' 5"  (1.651 m)   Wt 180 lb (81.6 kg)   LMP  03/24/2022   SpO2 100%   BMI 29.95 kg/m   Visual Acuity Right Eye Distance:   Left Eye Distance:   Bilateral Distance:    Right Eye Near:   Left Eye Near:    Bilateral Near:     Physical Exam Constitutional:      Appearance: Normal appearance.  HENT:     Head: Normocephalic.  Eyes:     Extraocular Movements: Extraocular movements intact.  Pulmonary:     Effort: Pulmonary effort is normal.  Skin:    Comments: Erythema, mild to moderate swelling and tenderness following the medial and proximal nailbed with puslike drainage along the medial aspect, range of motion intact, able to bear weight, capillary refill less than 3  Neurological:     Mental Status: She is alert and oriented to person, place, and time. Mental status is at baseline.  Psychiatric:        Mood and Affect: Mood normal.        Behavior: Behavior normal.      UC Treatments / Results  Labs (all labs ordered are listed, but only abnormal results are displayed) Labs Reviewed - No data to display  EKG   Radiology No results found.  Procedures Procedures (including critical care time)  Medications Ordered  in UC Medications - No data to display  Initial Impression / Assessment and Plan / UC Course  I have reviewed the triage vital signs and the nursing notes.  Pertinent labs & imaging results that were available during my care of the patient were reviewed by me and considered in my medical decision making (see chart for details).  Paronychia of the right great toe right foot  Presentation is consistent with infection, discussed with patient,'s doxycycline prescribed, 10-day course given due to history of diabetes, recommended warm soaks or compresses to the affected area and ibuprofen 800 mg prescribed for management of discomfort, given walking referral to podiatry if symptoms persist or worsen and for removal of ingrown toenail after infection has subsided, may follow-up with his urgent care as needed Final Clinical Impressions(s) / UC Diagnoses   Final diagnoses:  None   Discharge Instructions   None    ED Prescriptions   None    PDMP not reviewed this encounter.   Hans Eden, NP 04/24/22 1122

## 2022-11-20 IMAGING — CR DG HAND COMPLETE 3+V*L*
3 series · 3 of 3 positions shown · non-contrast
Comparison: None.

CLINICAL DATA: Fall last week, pain

EXAM:
LEFT HAND - COMPLETE 3+ VIEW

[hand ap]
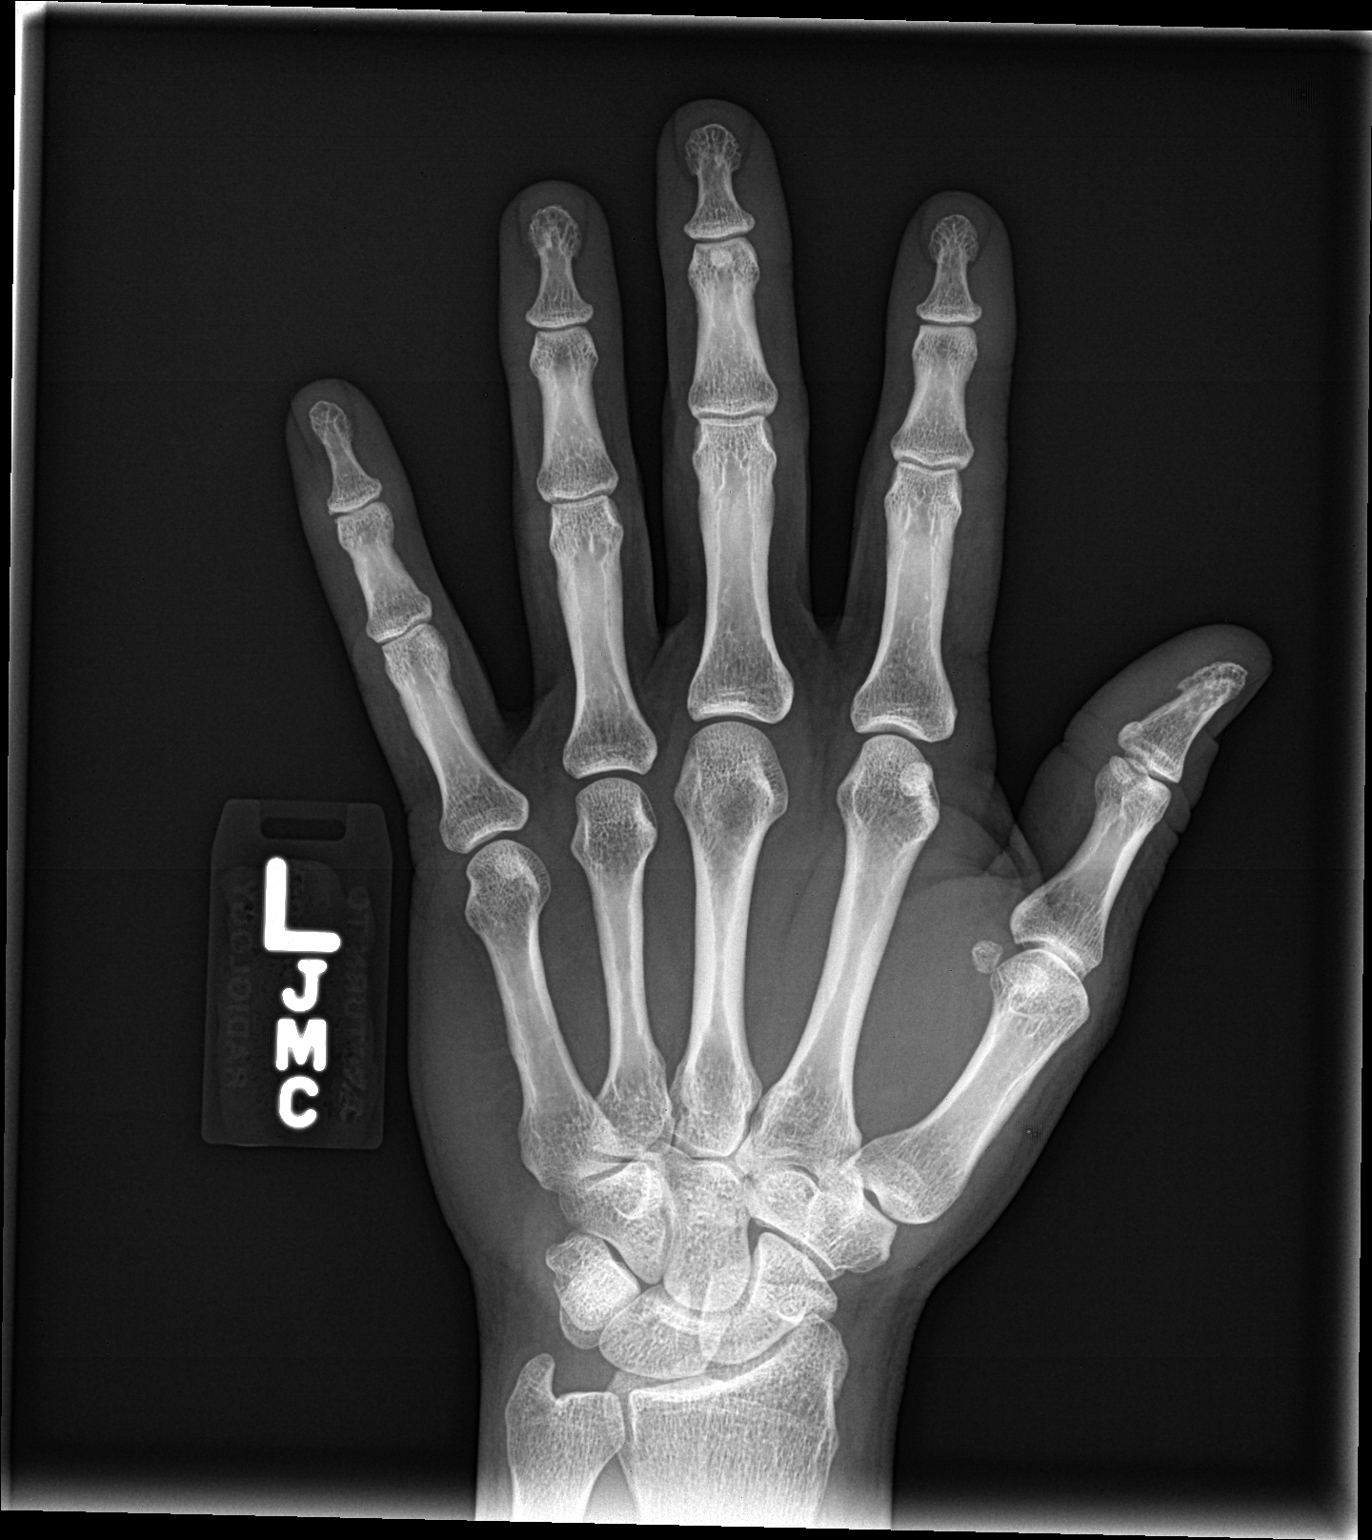

[hand obl]
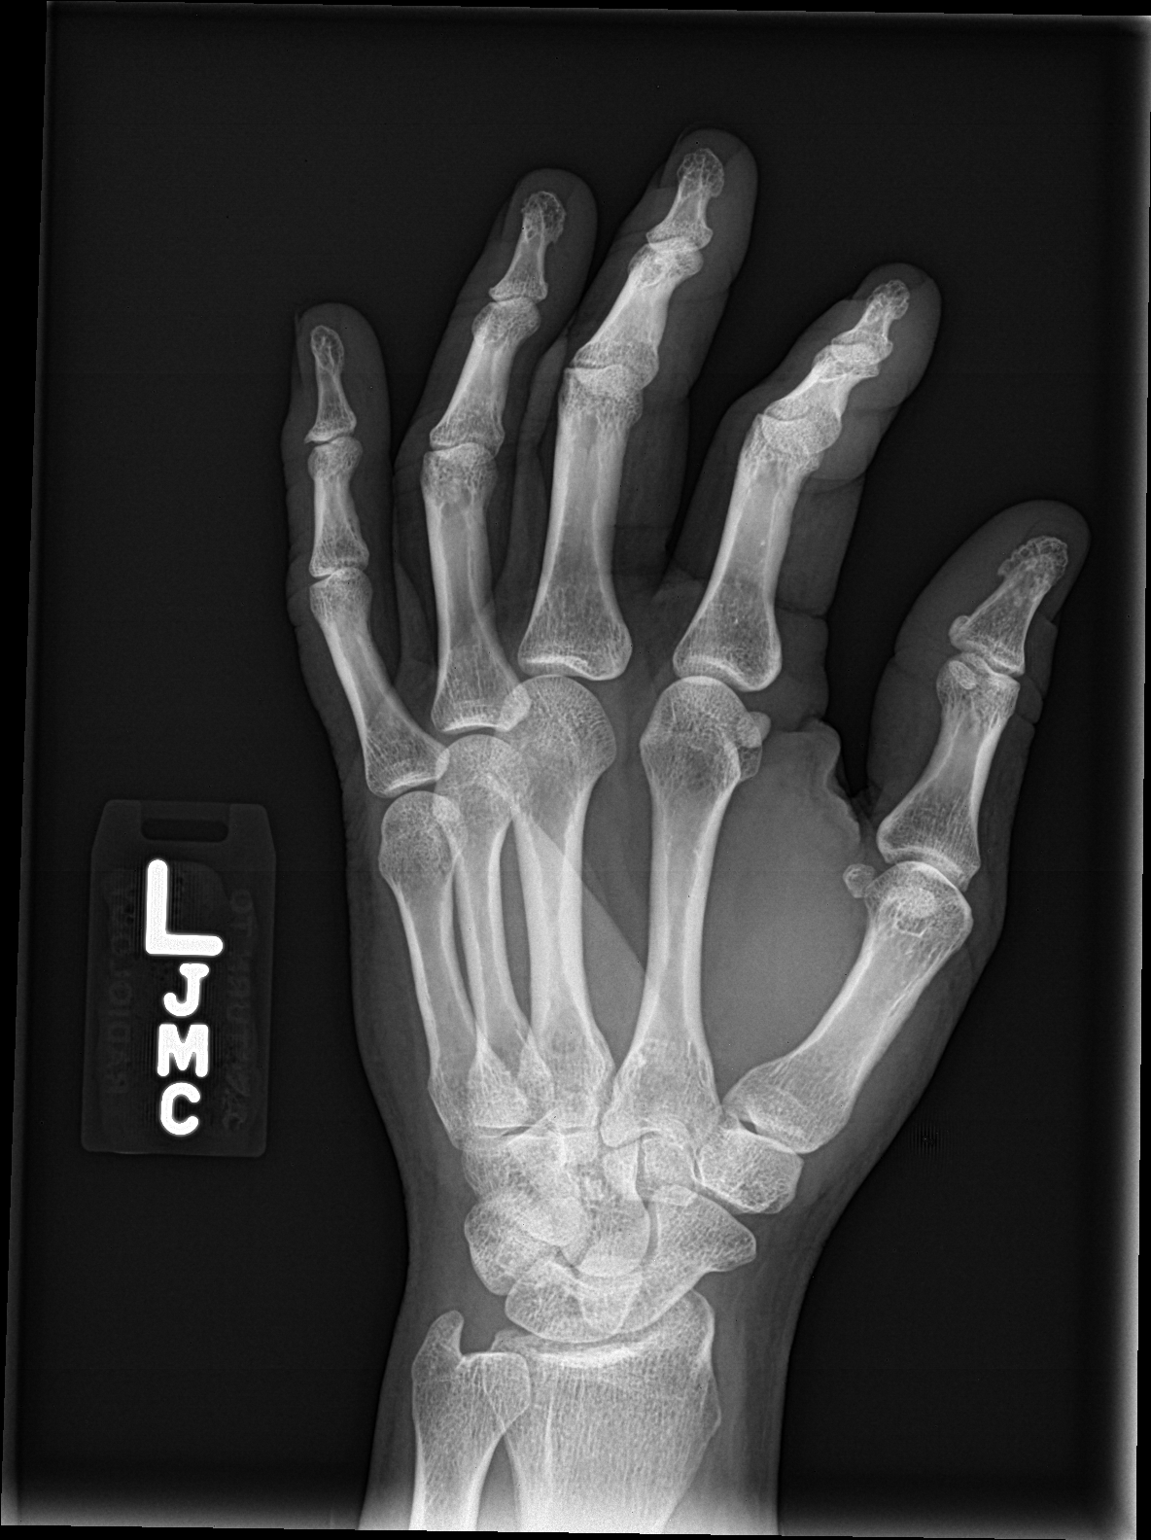

[hand lat]
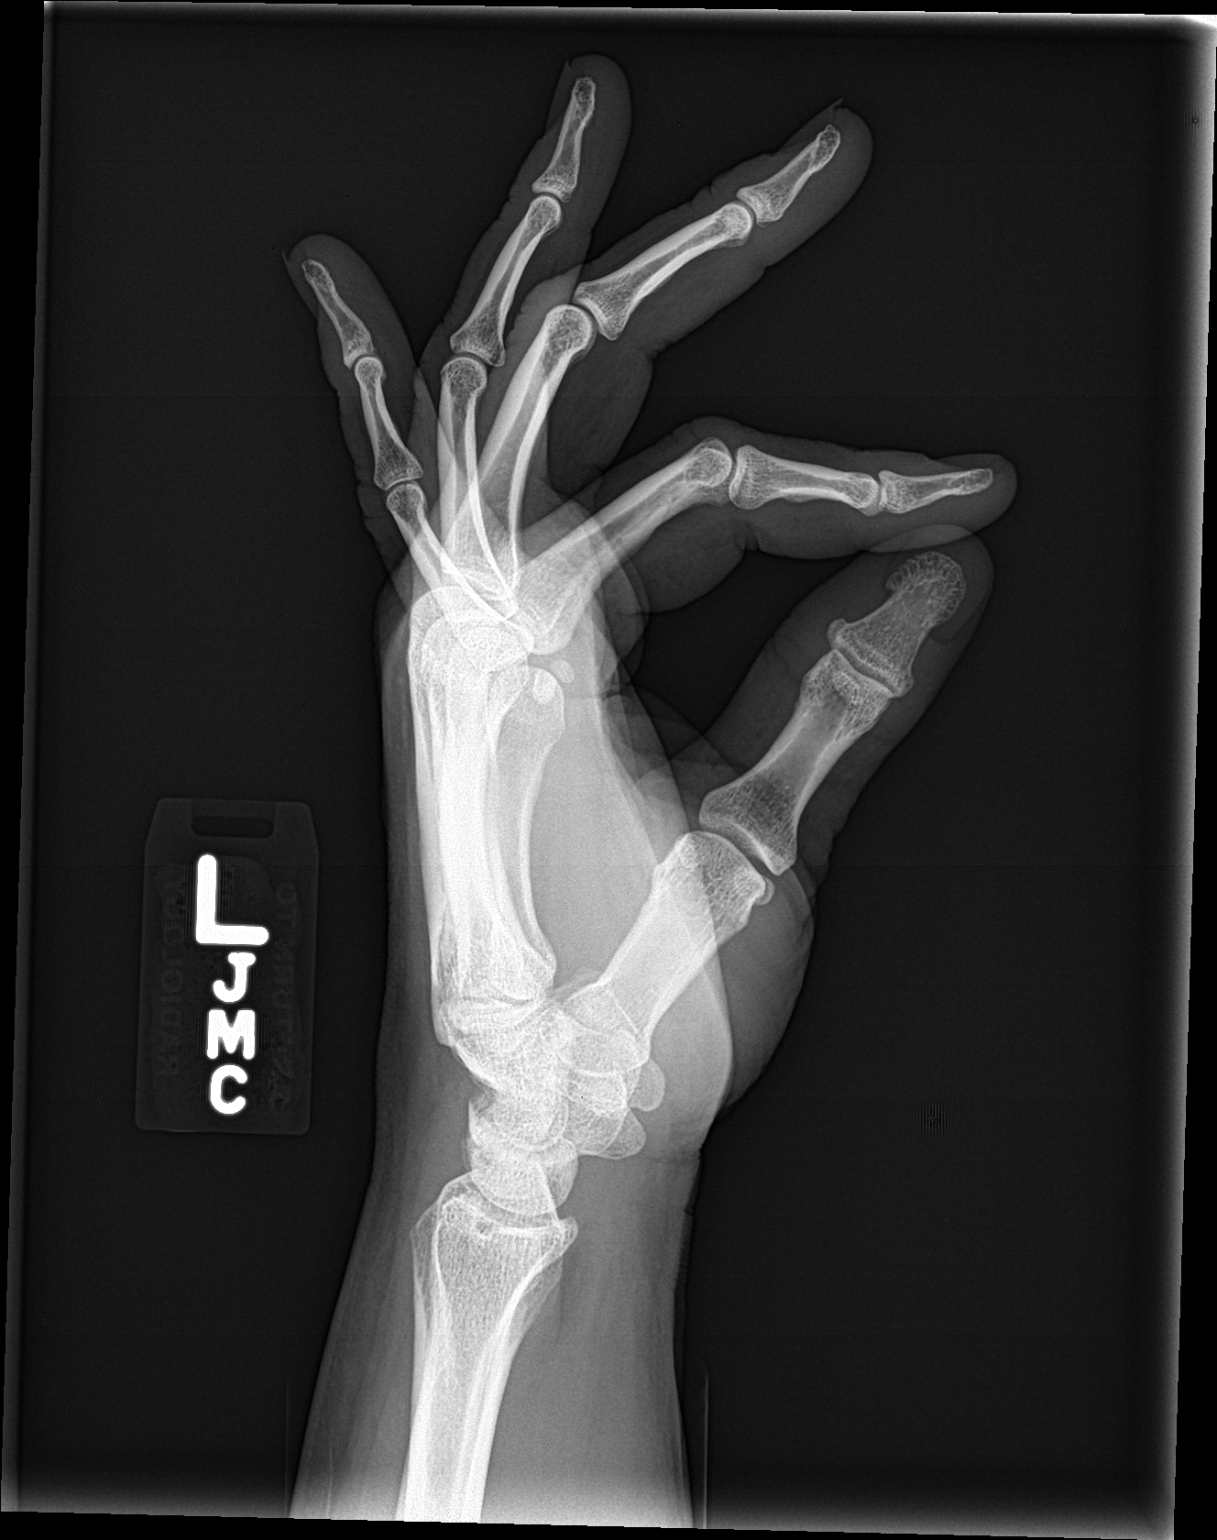

[3 of 3 positions shown; findings below may reference images not displayed]

FINDINGS: There is no evidence of fracture or dislocation. There is no
evidence of arthropathy or other focal bone abnormality. Soft
tissues are unremarkable.
IMPRESSION: No fracture or dislocation of the left hand. Joint spaces are
preserved.

## 2024-02-28 ENCOUNTER — Telehealth: Payer: Self-pay

## 2024-02-28 NOTE — Telephone Encounter (Signed)
 Phone call to pt at [phone]. Rang several times and then message stated person is Not able to receive calls at this time.  Unable to leave message. Tried twice.

## 2024-02-28 NOTE — Telephone Encounter (Addendum)
 Call pt re positive Hep B test results reported from [facility]:  02/18/24 Plasma HBsAg Positive Preliminary, resulted on 02/25/24.  02/18/24 Plasma HBsAg Neut (Confirm) Positive Confirmatory, resulted on 02/25/24  ---------------------------- Confirm by testing through their  own provider or local health department.  If the pt refuses or is lost to follow-up  then the CD nurse is finished and the positive from the donation center will be  submitted to the state for reporting. Events that have retesting identified as pending will need to remain in [facility] workflows a. This is in part because the HBV DNA and HCV RNA will show up much earlier than the HBsAg or anti-HBC. Follow-up testing 1-3 month after initial testing could reveal an acute infection.  b. Call the donation center and request the serologic tests (including negatives) that were performed in addition to those on the [facility] Form 2124 Part 1 and follow the same investigation process you do with all your other HBV events.

## 2024-03-07 NOTE — Telephone Encounter (Signed)
 Phone call to pt at [phone]. Subscriber dialed not in service. Tried twice.  MyChart not available/in use.

## 2024-03-08 NOTE — Telephone Encounter (Signed)
 Phone call to pt at [phone]. Subscriber dialed not in service. Tried twice.

## 2024-03-08 NOTE — Telephone Encounter (Signed)
 03/08/24 mailed letter re important health matter, please contact [facility].

## 2024-03-10 NOTE — Telephone Encounter (Addendum)
 Phone call to pt at [phone]. Subscriber dialed not in service.  Entering event in [place] EDSS.  ([facility] documents Hep B vaccination 5 days prior to Ag test.)
